# Patient Record
Sex: Female | Born: 1970 | Race: White | Hispanic: No | Smoking: Former smoker
Health system: Southern US, Community
[De-identification: ages and names within clinical notes are randomized; demographics above are authoritative.]

## PROBLEM LIST (undated history)

## (undated) DIAGNOSIS — J45909 Unspecified asthma, uncomplicated: Secondary | ICD-10-CM

## (undated) DIAGNOSIS — A239 Brucellosis, unspecified: Secondary | ICD-10-CM

## (undated) DIAGNOSIS — F419 Anxiety disorder, unspecified: Secondary | ICD-10-CM

## (undated) HISTORY — DX: Anxiety disorder, unspecified: F41.9

## (undated) HISTORY — PX: APPENDECTOMY: SHX54

## (undated) HISTORY — PX: DILATION AND CURETTAGE OF UTERUS: SHX78

## (undated) HISTORY — DX: Unspecified asthma, uncomplicated: J45.909

## (undated) HISTORY — PX: TUBAL LIGATION: SHX77

## (undated) HISTORY — PX: WISDOM TOOTH EXTRACTION: SHX21

---

## 2005-08-25 ENCOUNTER — Inpatient Hospital Stay: Payer: Self-pay | Admitting: Obstetrics and Gynecology

## 2009-03-23 ENCOUNTER — Ambulatory Visit: Payer: Self-pay

## 2009-07-20 ENCOUNTER — Ambulatory Visit: Payer: Self-pay

## 2009-07-21 ENCOUNTER — Ambulatory Visit: Payer: Self-pay

## 2010-03-06 ENCOUNTER — Ambulatory Visit: Payer: Self-pay | Admitting: Family Medicine

## 2010-03-06 ENCOUNTER — Ambulatory Visit: Payer: Self-pay | Admitting: Surgery

## 2010-03-10 LAB — PATHOLOGY REPORT

## 2013-07-10 NOTE — Progress Notes (Signed)
 Briana Chen is a 43 y.o. @GENDER @ that comes today for the following problem(s):   Chief Complaint  Patient presents with  . Follow-up    HPI: The patient in the office for follow up of anxiety, hyperlipidemia and vitamin-D deficiency.  She states she is feeling well overall but did not reduce the dose of her Effexor.  She would like to try and do this in the near future.  She states she is even considering going off the medicine completely and using a Saint John's Wort combination.  She denies sleep disturbance, focus difficulty, mood swings or suicidal/homicidal thoughts.  She continues to try and eat right and exercise regularly.  She denies chest pain, shortness of breath or palpitations.  She continues to take in 2000 IU of vitamin-D over the counter daily.  She denies fatigue.  She denies side effect problems with the medications.   Patient Active Problem List  Diagnosis  . Anxiety  . Asthma without status asthmaticus  . Hyperlipidemia  . Vitamin D  deficiency     Past Medical History  Diagnosis Date  . Anxiety     w/ panic & depression  . Carpal tunnel syndrome   . Asthma without status asthmaticus   . Hyperlipidemia   . Vitamin D  deficiency   . 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 Allergic rhinitis      Past Surgical History  Procedure Laterality Date  . Tubal ligation    . Wisdom teeth extraction    . Closed reduction nasal fracture    . Appendectomy  2012    History   Social History  . Marital Status: Married    Spouse Name: N/A    Number of Children: 2  . Years of Education: N/A   Occupational History  . Not on file.   Social History Main Topics  . Smoking status: Former Games developer  . Smokeless tobacco: Not on file  . Alcohol Use: 0.0 oz/week    0 Not specified per week  . Drug Use: Not on file  . Sexual Activity: Not on file   Other Topics Concern  . Not on file   Social History Narrative    Family History  Problem Relation Age of  Onset  . Depression Mother   . Osteoporosis Mother   . Alcohol abuse Father   . Bipolar disorder Father   . Alcohol abuse Maternal Grandmother   . Diabetes type II Maternal Grandmother   . Coronary artery disease Maternal Grandfather   . Schizophrenia Paternal Grandmother   . Alcohol abuse Paternal Grandmother   . Coronary artery disease Paternal Grandfather   . Stroke Paternal Grandfather      Current outpatient prescriptions:venlafaxine (EFFEXOR-XR) 75 MG XR capsule, Take 1 capsule (75 mg total) by mouth once daily., Disp: 30 capsule, Rfl: 0;  albuterol 90 mcg/actuation inhaler, Inhale 2 inhalations into the lungs every 6 (six) hours as needed for Wheezing., Disp: , Rfl: ;  venlafaxine (EFFEXOR-XR) 37.5 MG XR capsule, Take 1 capsule (37.5 mg total) by mouth once daily., Disp: 30 capsule, Rfl: 5   Objective:  Filed Vitals:   07/10/13 0836  BP: 120/72  Pulse: 73    Physical Examination:  GENERAL:  The patient is alert, oriented and in no acute distress.  HEENT:  Head is normocephalic/atraumatic.  Pupils equal, round and reactive to light and accommodation.  Extraocular movements intact.  Nose and throat are clear. NECK:  Supple without thyromegaly or lymphadenopathy.  CHEST:  Chest wall is within normal limits.   LUNGS:  Clear to auscultation and percussion.   CARDIAC:  Regular rate and rhythm, normal S1 and S2 without murmurs, rubs or gallops.   VASCULAR:  Distal pulses 2+.      EXTREMITIES:  No cyanosis, clubbing or edema noted.   NEUROLOGIC:  The patient is alert and oriented.  No focal deficits.  PSYCHIATRIC:  Normal mood and affect      A/P   Briana Chen was seen today for follow-up.  Diagnoses and associated orders for this visit:  Anxiety  Hyperlipidemia - Comprehensive Metabolic Panel (CMP) - Lipid Panel w/calc LDL  Vitamin D  deficiency - Vitamin D , 25-Hydroxy - [facility]  Other Orders - venlafaxine (EFFEXOR-XR) 75 MG XR capsule; Take 1 capsule (75 mg total)  by mouth once daily. - venlafaxine (EFFEXOR-XR) 37.5 MG XR capsule; Take 1 capsule (37.5 mg total) by mouth once daily.   Rx for 75mg  effexor to use for the next month and then patient with decrease to the 37.5mg  dose.  She will then decide after a couple months whether she wishes to stop completely and take the supplement. Continue otc Vitamin D  Counseled regarding diet and exercise - encourage to make healthy food choices and sensible portion sizes.  Encouraged regular aerobic activity 3-4 days per week. Reviewed most recent labs with patient.  Await above ordered labs Counseled extensively regarding pharmaceuticals vs neutraceuticals.  Return in about 6 months (around 01/09/2014) for Regular follow up.

## 2014-03-03 NOTE — Progress Notes (Signed)
 Briana Chen is a 44 y.o. female that comes today for the following problem(s):   Chief Complaint  Patient presents with  . Anxiety    HPI: The patient in the office for follow-up of anxiety.  She states she is feeling much better now that she is on 75 mg dose of the Effexor.  She will still note occasional difficulty sleeping but states she is not having panic symptoms.  She denies focus less concentration difficulty or suicidal/homicidal thoughts.  She continues to work on her coping mechanisms.  She states she still has a tendency to take on too many things and believes this is what caused her to relapse.  She denies side effect problems with the medications.  Patient Active Problem List  Diagnosis  . Anxiety  . Asthma without status asthmaticus  . Hyperlipidemia  . Vitamin D  deficiency     Past Medical History  Diagnosis Date  . Anxiety     w/ panic & depression  . Carpal tunnel syndrome   . Asthma without status asthmaticus   . Hyperlipidemia   . Vitamin D  deficiency   . 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 Allergic rhinitis      Past Surgical History  Procedure Laterality Date  . Tubal ligation    . Wisdom teeth extraction    . Closed reduction nasal fracture    . Appendectomy  2012    History   Social History  . Marital Status: Married    Spouse Name: N/A    Number of Children: 2  . Years of Education: N/A   Occupational History  . Not on file.   Social History Main Topics  . Smoking status: Former Games developer  . Smokeless tobacco: Not on file  . Alcohol Use: 0.0 oz/week    0 Not specified per week  . Drug Use: Not on file  . Sexual Activity: Not on file   Other Topics Concern  . Not on file   Social History Narrative    Family History  Problem Relation Age of Onset  . Depression Mother   . Osteoporosis Mother   . Alcohol abuse Father   . Bipolar disorder Father   . Alcohol abuse Maternal Grandmother   . Diabetes type II Maternal  Grandmother   . Coronary artery disease Maternal Grandfather   . Schizophrenia Paternal Grandmother   . Alcohol abuse Paternal Grandmother   . Coronary artery disease Paternal Grandfather   . Stroke Paternal Grandfather      No Known Allergies  Prior to Admission medications   Medication Sig Start Date End Date Taking? Authorizing Provider  albuterol 90 mcg/actuation inhaler Inhale 2 inhalations into the lungs every 6 (six) hours as needed for Wheezing.   Yes Historical Provider, MD  venlafaxine (EFFEXOR-XR) 75 MG XR capsule Take 1 capsule (75 mg total) by mouth once daily. 03/03/14 03/03/15 Yes Cheryl Menghini Jeffie Raddle., MD     Objective:  Filed Vitals:   03/03/14 1512  BP: 120/80  Pulse: 86    Physical Examination:  GENERAL:  The patient is alert, oriented and in no acute distress.  HEENT:  Head is normocephalic/atraumatic.  Pupils equal, round and reactive to light and accommodation.  Extraocular movements intact.  Nose and throat are clear. NECK:  Supple without thyromegaly or lymphadenopathy.   CHEST:  Chest wall is within normal limits.   LUNGS:  Clear to auscultation and percussion.   CARDIAC:  Regular rate and  rhythm, normal S1 and S2 without murmurs, rubs or gallops.   VASCULAR:  Distal pulses 2+.   EXTREMITIES:  No cyanosis, clubbing or edema noted.   NEUROLOGIC:  The patient is alert and oriented.  No focal deficits.  PSYCHIATRIC:  Normal mood and affect       A/P   Annise was seen today for anxiety.  Diagnoses and all orders for this visit:  Anxiety - improved Orders: -     venlafaxine (EFFEXOR-XR) 75 MG XR capsule; Take 1 capsule (75 mg total) by mouth once daily.  Continue current medications - renewed as needed Continue to work on coping mechanisms Consider seeing a counselor if anxiety worsens  Return in about 6 months (around 09/01/2014).

## 2014-10-13 NOTE — Progress Notes (Signed)
 Briana Chen is a 44 y.o. female that comes today for the following problem(s):   Chief Complaint  Patient presents with  . Follow-up    HPI: Patient in the office for follow up of chronic medical conditions including anxiety, hyperlipidemia and vitamin-D deficiency.  She states she is feeling well and is without acute complaint or concern.  She has been seeing a therapist and is working hard to improve her coping mechanisms.  She states the in current dose of the Effexor is working very well for her.  She denies mood swings, focus difficulty, sleep disturbance or suicidal/homicidal thoughts.  She continues to exercise regularly and eat a healthy diet.  She denies chest pain, shortness of breath or palpitations.  She is taking her vitamin-D supplement regularly.  She denies side effect problems with the medications.  Patient Active Problem List  Diagnosis  . Anxiety  . Asthma without status asthmaticus  . Hyperlipidemia  . Vitamin D  deficiency     Past Medical History  Diagnosis Date  . Allergic rhinitis   . Anxiety     w/ panic & depression  . Asthma without status asthmaticus   . Carpal tunnel syndrome   . Hyperlipidemia   . 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 Vitamin D  deficiency      Past Surgical History  Procedure Laterality Date  . Tubal ligation    . Wisdom teeth extraction    . Closed reduction nasal fracture    . Appendectomy  2012    Social History   Social History  . Marital status: Married    Spouse name: N/A  . Number of children: 2  . Years of education: N/A   Occupational History  . Not on file.   Social History Main Topics  . Smoking status: Former Games developer  . Smokeless tobacco: Not on file  . Alcohol use 0.0 oz/week    0 Standard drinks or equivalent per week  . Drug use: Not on file  . Sexual activity: Not on file   Other Topics Concern  . Not on file   Social History Narrative    Family History  Problem Relation Age of  Onset  . Depression Mother   . Osteoporosis Mother   . Alcohol abuse Father   . Bipolar disorder Father   . Alcohol abuse Maternal Grandmother   . Diabetes type II Maternal Grandmother   . Coronary artery disease Maternal Grandfather   . Schizophrenia Paternal Grandmother   . Alcohol abuse Paternal Grandmother   . Coronary artery disease Paternal Grandfather   . Stroke Paternal Grandfather      No Known Allergies  Prior to Admission medications   Medication Sig Start Date End Date Taking? Authorizing Provider  albuterol 90 mcg/actuation inhaler Inhale 2 inhalations into the lungs every 6 (six) hours as needed for Wheezing.   Yes Historical Provider, MD  venlafaxine (EFFEXOR-XR) 75 MG XR capsule TAKE 1 CAPSULE (75 MG TOTAL) BY MOUTH ONCE DAILY. 10/04/14  Yes Cheryl Menghini Jeffie Raddle., MD     Objective:  Visit Vitals  . BP 118/64  . Pulse 97  . Ht 172.7 cm (5' 8)  . Wt 61.2 kg (135 lb)  . LMP 10/13/2014 (Exact Date)  . SpO2 97%  . BMI 20.53 kg/m2    Physical Examination:  GENERAL:  The patient is alert, oriented and in no acute distress.  HEENT:  Head is normocephalic/atraumatic.  Pupils equal, round and reactive  to light and accommodation.  Extraocular movements intact.  Nose and throat are clear. NECK:  Supple without thyromegaly or lymphadenopathy.   CHEST:  Chest wall is within normal limits.   LUNGS:  Clear to auscultation and percussion.   CARDIAC:  Regular rate and rhythm, normal S1 and S2 without murmurs, rubs or gallops.   VASCULAR:  Distal pulses 2+.    EXTREMITIES:  No cyanosis, clubbing or edema noted.   NEUROLOGIC:  The patient is alert and oriented.  No focal deficits.       A/P   Briana Chen was seen today for follow-up.  Diagnoses and all orders for this visit:  Anxiety - stable  Pure hypercholesterolemia - stable -     Comprehensive Metabolic Panel (CMP); Future -     Lipid Panel w/calc LDL; Future  Vitamin D  deficiency - stable -     Vitamin  D, 25-Hydroxy - [facility]; Future  Continue current medications -  patient instructed to have the pharmacy contact our office between visits for refills Counseled regarding diet and exercise - encourage to make healthy food choices and sensible portion sizes.  Encouraged regular aerobic activity 3-4 days per week. Reviewed most recent labs with patient.  Await above ordered labs Continue to work on coping mechanisms  Return in about 6 months (around 04/12/2015) for Regular follow up.

## 2015-05-26 NOTE — Progress Notes (Signed)
 Briana Chen is a 45 y.o. female that comes today for the following problem(s):   Chief Complaint  Patient presents with  . Follow-up    HPI: Patient in the office for follow-up of anxiety.  She states she is feeling well and is without acute complaint or concern.  She states the Effexor continues to work well to control her moods.  She denies excessive mood swings, sleep disturbance, focus difficulty or suicidal/homicidal thoughts.  She states her stressors are stable and she wishes to remain on the medication.  She denies side effect problems with the medication.  Patient Active Problem List  Diagnosis  . Anxiety, unspecified  . Asthma without status asthmaticus, unspecified  . Hyperlipidemia, unspecified  . Vitamin D  deficiency, unspecified     Past Medical History  Diagnosis Date  . Allergic rhinitis   . Anxiety, unspecified     w/ panic & depression  . Asthma without status asthmaticus, unspecified   . Carpal tunnel syndrome   . Hyperlipidemia, unspecified   . 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 Vitamin D  deficiency, unspecified      Past Surgical History  Procedure Laterality Date  . Tubal ligation    . Wisdom teeth extraction    . Closed reduction nasal fracture    . Appendectomy  2012    Social History   Social History  . Marital status: Married    Spouse name: N/A  . Number of children: 2  . Years of education: N/A   Occupational History  . Not on file.   Social History Main Topics  . Smoking status: Former Games developer  . Smokeless tobacco: Not on file  . Alcohol use 0.0 oz/week    0 Standard drinks or equivalent per week  . Drug use: Not on file  . Sexual activity: Not on file   Other Topics Concern  . Not on file   Social History Narrative    Family History  Problem Relation Age of Onset  . Depression Mother   . Osteoporosis Mother   . Alcohol abuse Father   . Bipolar disorder Father   . Alcohol abuse Maternal Grandmother   .  Diabetes type II Maternal Grandmother   . Coronary artery disease Maternal Grandfather   . Schizophrenia Paternal Grandmother   . Alcohol abuse Paternal Grandmother   . Coronary artery disease Paternal Grandfather   . Stroke Paternal Grandfather      No Known Allergies  Prior to Admission medications   Medication Sig Taking? Last Dose  albuterol 90 mcg/actuation inhaler Inhale 2 inhalations into the lungs every 6 (six) hours as needed for Wheezing.  Taking  venlafaxine (EFFEXOR-XR) 75 MG XR capsule Take 1 capsule (75 mg total) by mouth once daily.    venlafaxine (EFFEXOR-XR) 75 MG XR capsule TAKE 1 CAPSULE (75 MG TOTAL) BY MOUTH ONCE DAILY.       Objective:  Visit Vitals  . BP 108/60  . Pulse 60  . Ht 172.7 cm (5' 8)  . Wt 61.2 kg (135 lb)  . SpO2 98%  . BMI 20.53 kg/m2    Physical Examination:  GENERAL:  The patient is alert, oriented and in no acute distress.  HEENT:  Head is normocephalic/atraumatic.  Pupils equal, round and reactive to light and accommodation.  Extraocular movements intact.  Nose and throat are clear. NECK:  Supple without thyromegaly or lymphadenopathy.   CHEST:  Chest wall is within normal limits.   LUNGS:  Clear to auscultation and percussion.   CARDIAC:  Regular rate and rhythm, normal S1 and S2 without murmurs, rubs or gallops.   VASCULAR:  Distal pulses 2+.   ABDOMEN:  Soft. No organomegaly or tenderness found.    EXTREMITIES:  No cyanosis, clubbing or edema noted.   NEUROLOGIC:  The patient is alert and oriented.  No focal deficits.       A/P   Bilan was seen today for follow-up.  Diagnoses and all orders for this visit:  Anxiety, unspecified - stable  Pure hypercholesterolemia -     Comprehensive Metabolic Panel (CMP); Future -     Lipid Panel w/calc LDL; Future  Vitamin D  deficiency, unspecified -     Vitamin D , 25-Hydroxy - [facility]; Future  Continue current medications - renewed as needed and patient instructed to have the  pharmacy contact our office between visits for refills Continue to work on coping mechanisms for anxiety Reviewed most recent labs with patient.  Await above ordered labs prior to next appointment  Return in about 6 months (around 11/26/2015) for Regular follow up, Schedule labs.

## 2016-02-22 NOTE — Progress Notes (Signed)
 Briana Chen is a 46 y.o. female that comes today for the following problem(s):   Chief Complaint  Patient presents with  . Influenza    HPI: Patient in the office with complaint of a cough since Monday.  She has also noted the onset of a fever this morning.  She denies nasal congestion or postnasal drip.  She denies sore throat or ear pain.  She notes some shortness of breath and chest tightness but denies wheezing.  She notes sick contacts with similar symptoms.  She has not taken any medications.  Patient Active Problem List  Diagnosis  . Anxiety, unspecified  . Asthma without status asthmaticus, unspecified  . Hyperlipidemia, unspecified  . Vitamin D  deficiency, unspecified     Past Medical History:  Diagnosis Date  . Allergic rhinitis   . Anxiety, unspecified    w/ panic & depression  . Asthma without status asthmaticus, unspecified   . Carpal tunnel syndrome   . Hyperlipidemia, unspecified   . Other nonspecific abnormal finding 2003   CIN-1  . Vitamin D  deficiency, unspecified      Past Surgical History:  Procedure Laterality Date  . APPENDECTOMY  2012  . CLOSED REDUCTION NASAL FRACTURE    . TUBAL LIGATION    . Wisdom Teeth Extraction      Social History   Social History  . Marital status: Married    Spouse name: N/A  . Number of children: 2  . Years of education: N/A   Occupational History  . Not on file.   Social History Main Topics  . Smoking status: Former Games developer  . Smokeless tobacco: Never Used  . Alcohol use 0.0 oz/week  . Drug use: Unknown  . Sexual activity: Not on file   Other Topics Concern  . Not on file   Social History Narrative  . No narrative on file    Family History  Problem Relation Age of Onset  . Depression Mother   . Osteoporosis (Thinning of bones) Mother   . Alcohol abuse Father   . Bipolar disorder Father   . Alcohol abuse Maternal Grandmother   . Diabetes type II Maternal Grandmother   . Coronary Artery Disease  (Blocked arteries around heart) Maternal Grandfather   . Schizophrenia Paternal Grandmother   . Alcohol abuse Paternal Grandmother   . Coronary Artery Disease (Blocked arteries around heart) Paternal Grandfather   . Stroke Paternal Grandfather      No Known Allergies  Prior to Admission medications   Medication Sig Taking? Last Dose  albuterol (VENTOLIN HFA) 90 mcg/actuation inhaler Inhale 2 inhalations into the lungs every 6 (six) hours as needed.    oseltamivir (TAMIFLU) 75 MG capsule Take 1 capsule (75 mg total) by mouth 2 (two) times daily for 5 days.    venlafaxine (EFFEXOR-XR) 75 MG XR capsule Take 1 capsule (75 mg total) by mouth once daily.    venlafaxine (EFFEXOR-XR) 75 MG XR capsule TAKE 1 CAPSULE (75 MG TOTAL) BY MOUTH ONCE DAILY.    venlafaxine (EFFEXOR-XR) 75 MG XR capsule TAKE 1 CAPSULE (75 MG TOTAL) BY MOUTH ONCE DAILY.       Objective:  BP 110/80   Pulse 88   Temp (!) 38.3 C (101 F)   Ht 172.7 cm (5' 8)   Wt 62.6 kg (138 lb)   SpO2 99%   BMI 20.98 kg/m   Physical Examination:  GENERAL:  The patient is alert, oriented and in no acute distress.  HEENT:  Head  is normocephalic/atraumatic.  Pupils equal, round and reactive to light and accommodation.  Extraocular movements intact.  TM clear bilaterally.  Nose with erythema.  Throat with PND.   No sinus tenderness. NECK:  Supple without thyromegaly or lymphadenopathy.   CHEST:  Chest wall is within normal limits.   LUNGS:  Clear to auscultation and percussion.   CARDIAC:  Regular rate and rhythm, normal S1 and S2 without murmurs, rubs or gallops.          A/P   Briana Chen was seen today for influenza.  Diagnoses and all orders for this visit:  Influenza - new -     Influenza A/B -     oseltamivir (TAMIFLU) 75 MG capsule; Take 1 capsule (75 mg total) by mouth 2 (two) times daily for 5 days. -     albuterol (VENTOLIN HFA) 90 mcg/actuation inhaler; Inhale 2 inhalations into the lungs every 6 (six) hours as  needed.  Rx for tamiflu and albuterol Take all prescription medications as directed and for full duration unless otherwise instructed Get plenty of fluids and rest Use otc cough and cold medications as needed, avoiding decongestants if has a history of hypertension Saline nasal spray and salt water gargle as needed Tea with honey and lemon for cough as needed Follow up in the office or call if no improvement in 5-7 days  Return if symptoms worsen or fail to improve.

## 2016-06-08 ENCOUNTER — Telehealth: Payer: Self-pay | Admitting: Obstetrics and Gynecology

## 2016-06-08 NOTE — Telephone Encounter (Signed)
Patient lvm to schedule appointment, I lvm for patient to call back and schedule appointment. Thank you.

## 2016-07-25 ENCOUNTER — Emergency Department
Admission: EM | Admit: 2016-07-25 | Discharge: 2016-07-25 | Disposition: A | Discharge status: Home / Self Care | Payer: BLUE CROSS/BLUE SHIELD | Attending: Emergency Medicine | Admitting: Emergency Medicine

## 2016-07-25 ENCOUNTER — Emergency Department: Payer: BLUE CROSS/BLUE SHIELD

## 2016-07-25 DIAGNOSIS — Z79899 Other long term (current) drug therapy: Secondary | ICD-10-CM | POA: Insufficient documentation

## 2016-07-25 DIAGNOSIS — R42 Dizziness and giddiness: Secondary | ICD-10-CM | POA: Diagnosis present

## 2016-07-25 MED ORDER — DIAZEPAM 5 MG PO TABS: 5.0000 mg | ORAL_TABLET | Freq: Four times a day (QID) | ORAL | 0 refills | Status: AC | PRN

## 2016-07-25 MED ORDER — DIAZEPAM 5 MG PO TABS
5.0000 mg | ORAL_TABLET | Freq: Once | ORAL | Status: AC
  Administered 2016-07-25: 5 mg via ORAL
  Filled 2016-07-25: qty 1

## 2016-07-25 MED ORDER — IOPAMIDOL (ISOVUE-370) INJECTION 76%
75.0000 mL | Freq: Once | INTRAVENOUS | Status: AC | PRN
  Administered 2016-07-25: 75 mL via INTRAVENOUS

## 2016-07-25 MED ORDER — MECLIZINE HCL 25 MG PO TABS
25.0000 mg | ORAL_TABLET | Freq: Once | ORAL | Status: AC
  Administered 2016-07-25: 25 mg via ORAL
  Filled 2016-07-25: qty 1

## 2016-07-25 MED ORDER — MECLIZINE HCL 25 MG PO TABS: 25.0000 mg | ORAL_TABLET | Freq: Three times a day (TID) | ORAL | 0 refills | Status: DC | PRN

## 2016-07-25 MED ORDER — ONDANSETRON HCL 4 MG/2ML IJ SOLN
4.0000 mg | Freq: Once | INTRAMUSCULAR | Status: AC
  Administered 2016-07-25: 4 mg via INTRAVENOUS
  Filled 2016-07-25: qty 2

## 2016-07-25 NOTE — ED Notes (Signed)
Pt ambulated in hallway without assistance. Slight dizziness otherwise pt felt better

## 2016-07-25 NOTE — ED Triage Notes (Signed)
Pt c/o sudden onset dizziness with nausea after having a massage, states that the masseuse stated that she did loosen a knot behind the ear that may have affected her inner ear.

## 2016-07-25 NOTE — ED Notes (Signed)
Pt in CT.

## 2016-07-25 NOTE — ED Provider Notes (Signed)
[facility] Emergency Department Provider Note  ____________________________________________  Time seen: Approximately 5:28 PM  I have reviewed the triage vital signs and the nursing notes.   HISTORY  Chief Complaint Dizziness   HPI Briana Chen is a 46 y.o. female no significant past medical history who presents for evaluation of vertigo. Patient reports that this morning she underwent a massage. Her masseuse asked her to change the position of her head from right to left multiple times. Patient reports that he was massaging behind her left ear when patient felt a pop and had sudden onset of severe nausea. Since then patient has been having vertigo which is worse with movement of her head but has been pretty consistent throughout the day. Has had severe nausea associated with it. No vomiting. No tinnitus, no decreasing here, no diplopia, no dysarthria, no dysphasia, difficulty finding words, no facial droop, no unilateral weakness or numbness. No headache. Patient is not a smoker. No family history of stroke  History reviewed. No pertinent past medical history.  There are no active problems to display for this patient.   History reviewed. No pertinent surgical history.  Prior to Admission medications   Medication Sig Start Date End Date Taking? Authorizing Provider  albuterol (PROVENTIL HFA;VENTOLIN HFA) 108 (90 Base) MCG/ACT inhaler Inhale 2 puffs into the lungs every 6 (six) hours as needed. 02/22/16  Yes [provider]  venlafaxine XR (EFFEXOR-XR) 75 MG 24 hr capsule Take 75 mg by mouth daily. 07/22/16  Yes [provider]  diazepam (VALIUM) 5 MG tablet Take 1 tablet (5 mg total) by mouth every 6 (six) hours as needed (vertigo). 07/25/16 07/25/17  Nita SickleVeronese, Minor, MD  meclizine (ANTIVERT) 25 MG tablet Take 1 tablet (25 mg total) by mouth 3 (three) times daily as needed for dizziness. 07/25/16   Nita SickleVeronese, Lynn, MD    Allergies Patient  has no known allergies.  No family history on file.  Social History Social History  Substance Use Topics  . Smoking status: Never Smoker  . Smokeless tobacco: Never Used  . Alcohol use Yes     Comment: occassionally    Review of Systems  Constitutional: Negative for fever. Eyes: Negative for visual changes. ENT: Negative for sore throat. Neck: No neck pain  Cardiovascular: Negative for chest pain. Respiratory: Negative for shortness of breath. Gastrointestinal: Negative for abdominal pain, vomiting or diarrhea. + nausea Genitourinary: Negative for dysuria. Musculoskeletal: Negative for back pain. Skin: Negative for rash. Neurological: Negative for headaches, weakness or numbness. + vertigo Psych: No SI or HI  ____________________________________________   PHYSICAL EXAM:  VITAL SIGNS: ED Triage Vitals  Enc Vitals Group     BP 07/25/16 1709 119/74     Pulse Rate 07/25/16 1709 78     Resp 07/25/16 1709 18     Temp 07/25/16 1709 99.1 F (37.3 C)     Temp Source 07/25/16 1709 Oral     SpO2 07/25/16 1709 99 %     Weight 07/25/16 1701 130 lb (59 kg)     Height 07/25/16 1701 5\' 8"  (1.727 m)     Head Circumference --      Peak Flow --      Pain Score --      Pain Loc --      Pain Edu? --      Excl. in GC? --     Constitutional: Alert and oriented. Well appearing and in no apparent distress. HEENT:  Head: Normocephalic and atraumatic.         Eyes: Conjunctivae are normal. Sclera is non-icteric.       Ears: Bilateral TMs visualized with no evidence of infection or perforation      Mouth/Throat: Mucous membranes are moist.       Neck: Supple with no signs of meningismus. No bruit Cardiovascular: Regular rate and rhythm. No murmurs, gallops, or rubs. 2+ symmetrical distal pulses are present in all extremities. No JVD. Respiratory: Normal respiratory effort. Lungs are clear to auscultation bilaterally. No wheezes, crackles, or rhonchi.  Gastrointestinal: Soft, non  tender, and non distended with positive bowel sounds. No rebound or guarding. Musculoskeletal: Nontender with normal range of motion in all extremities. No edema, cyanosis, or erythema of extremities. Neurologic: Normal speech and language. A & O x3, PERRL, no nystagmus, CN II-XII intact, motor testing reveals good tone and bulk throughout. There is no evidence of pronator drift or dysmetria. Muscle strength is 5/5 throughout. Deep tendon reflexes are 2+ throughout with downgoing toes. Sensory examination is intact. Gait deferred Skin: Skin is warm, dry and intact. No rash noted. Psychiatric: Mood and affect are normal. Speech and behavior are normal.  ____________________________________________   LABS (all labs ordered are listed, but only abnormal results are displayed)  Labs Reviewed - No data to display ____________________________________________  EKG  none  ____________________________________________  RADIOLOGY  CTA neck: negative  CT head: negative  ____________________________________________   PROCEDURES  Procedure(s) performed:Yes Procedures   Dix Hallpike maneuver performed which elicited vertigo when patient's face was towards the left but no nystagmus  Critical Care performed:  None ____________________________________________   INITIAL IMPRESSION / ASSESSMENT AND PLAN / ED COURSE  46 y.o. female no significant past medical history who presents for evaluation of vertigo that started after patient felt a pop while receiving a massage behind the left ear. Patient is neurologically intact, no nystagmus. Dix-Hallpike produced worsening vertigo however with no nystagmus. She has no bruit on her left carotid. Will get CTA neck to rule out dissection. Will give zofran, valium, and meclezine for symptom relief in case this is BPPV.  Clinical Course as of Jul 26 2003  Wed Jul 25, 2016  1958 CTA of the neck and CT of the head with no acute findings. Patient is now  able to walk without dizziness or vertigo after receiving Valium and meclizine. We'll discharge her home on both prescription medications and Epley maneuver. She remains neuro intact. Recommend follow-up with primary care doctor  [CV]    Clinical Course User Index [CV] Nita Sickle, MD    Pertinent labs & imaging results that were available during my care of the patient were reviewed by me and considered in my medical decision making (see chart for details).    ____________________________________________   FINAL CLINICAL IMPRESSION(S) / ED DIAGNOSES  Final diagnoses:  Vertigo      NEW MEDICATIONS STARTED DURING THIS VISIT:  New Prescriptions   DIAZEPAM (VALIUM) 5 MG TABLET    Take 1 tablet (5 mg total) by mouth every 6 (six) hours as needed (vertigo).   MECLIZINE (ANTIVERT) 25 MG TABLET    Take 1 tablet (25 mg total) by mouth 3 (three) times daily as needed for dizziness.     Note:  This document was prepared using Dragon voice recognition software and may include unintentional dictation errors.    Don Perking, [place], MD 07/25/16 2005

## 2016-08-16 ENCOUNTER — Ambulatory Visit (INDEPENDENT_AMBULATORY_CARE_PROVIDER_SITE_OTHER): Payer: BLUE CROSS/BLUE SHIELD | Admitting: Obstetrics and Gynecology

## 2016-08-16 ENCOUNTER — Encounter: Payer: Self-pay | Admitting: Obstetrics and Gynecology

## 2016-08-16 ENCOUNTER — Other Ambulatory Visit: Payer: Self-pay | Admitting: Obstetrics and Gynecology

## 2016-08-16 VITALS — BP 120/77 | HR 66 | Ht 68.0 in | Wt 134.2 lb

## 2016-08-16 DIAGNOSIS — Z01411 Encounter for gynecological examination (general) (routine) with abnormal findings: Secondary | ICD-10-CM

## 2016-08-16 DIAGNOSIS — L03317 Cellulitis of buttock: Secondary | ICD-10-CM | POA: Diagnosis not present

## 2016-08-16 DIAGNOSIS — L0231 Cutaneous abscess of buttock: Secondary | ICD-10-CM | POA: Diagnosis not present

## 2016-08-16 MED ORDER — CEFIXIME 400 MG PO CAPS: 400.0000 mg | ORAL_CAPSULE | Freq: Every day | ORAL | 1 refills | Status: DC

## 2016-08-16 NOTE — Progress Notes (Signed)
Subjective:   Briana IsaacsJill L Chen is a 46 y.o. G2P2 Caucasian female here for a routine well-woman exam.  Patient's last menstrual period was 08/03/2016.    Current complaints: pimple on buttock x1 week that is getting worse, with foul drainage PCP: Feldpausch       does desire labs  Social History: Sexual: heterosexual Marital Status: separated Living situation: with family Occupation: Systems analystpersonal trainer  Tobacco/alcohol: no tobacco use Illicit drugs: no history of illicit drug use  The following portions of the patient's history were reviewed and updated as appropriate: allergies, current medications, past family history, past medical history, past social history, past surgical history and problem list.  Past Medical History Past Medical History:  Diagnosis Date  . Asthma     Past Surgical History Past Surgical History:  Procedure Laterality Date  . TUBAL LIGATION      Gynecologic History G2P2  Patient's last menstrual period was 08/03/2016. Contraception: tubal ligation Last Pap: 2011. Results were: normal Last mammogram: 2011. Results were: normal   Obstetric History OB History  Gravida Para Term Preterm AB Living  2 2          SAB TAB Ectopic Multiple Live Births               # Outcome Date GA Lbr Len/2nd Weight Sex Delivery Anes PTL Lv  2 Para 2007    M Vag-Spont     1 Para 2003    M Vag-Spont         Current Medications Current Outpatient Prescriptions on File Prior to Visit  Medication Sig Dispense Refill  . albuterol (PROVENTIL HFA;VENTOLIN HFA) 108 (90 Base) MCG/ACT inhaler Inhale 2 puffs into the lungs every 6 (six) hours as needed.    . venlafaxine XR (EFFEXOR-XR) 75 MG 24 hr capsule Take 75 mg by mouth daily.  2  . diazepam (VALIUM) 5 MG tablet Take 1 tablet (5 mg total) by mouth every 6 (six) hours as needed (vertigo). (Patient not taking: Reported on 08/16/2016) 10 tablet 0  . meclizine (ANTIVERT) 25 MG tablet Take 1 tablet (25 mg total) by mouth 3 (three)  times daily as needed for dizziness. (Patient not taking: Reported on 08/16/2016) 30 tablet 0   No current facility-administered medications on file prior to visit.     Review of Systems Patient denies any headaches, blurred vision, shortness of breath, chest pain, abdominal pain, problems with bowel movements, urination, or intercourse.  Objective:  BP 120/77   Pulse 66   Ht 5\' 8"  (1.727 m)   Wt 134 lb 3.2 oz (60.9 kg)   LMP 08/03/2016   BMI 20.41 kg/m  Physical Exam  General:  Well developed, well nourished, no acute distress. She is alert and oriented x3. Skin:  Warm and dry except for boil on right buttock with yellow drainage express, moderate cellulitis measuring 3cm x 2cm. Neck:  Midline trachea, no thyromegaly or nodules Cardiovascular: Regular rate and rhythm, no murmur heard Lungs:  Effort normal, all lung fields clear to auscultation bilaterally Breasts:  No dominant palpable mass, retraction, or nipple discharge Abdomen:  Soft, non tender, no hepatosplenomegaly or masses Pelvic:  External genitalia is normal in appearance.  The vagina is normal in appearance. The cervix is bulbous, no CMT.  Thin prep pap is done with HR HPV cotesting. Uterus is felt to be normal size, shape, and contour.  No adnexal masses or tenderness noted.  Extremities:  No swelling or varicosities noted Psych:  She  has a normal mood and affect  Assessment:   Healthy well-woman exam Cellulitis and boil of right buttock  Plan:  Culture obtained and started on Septra x 10 days. Will return in 2 weeks if not completely healed. F/U 1 year for AE, or sooner if needed Mammogram ordered  Melody Suzan NailerN Shambley, CNM

## 2016-08-16 NOTE — Patient Instructions (Signed)
Preventive Care 18-39 Years, Female Preventive care refers to lifestyle choices and visits with your health care provider that can promote health and wellness. What does preventive care include?  A yearly physical exam. This is also called an annual well check.  Dental exams once or twice a year.  Routine eye exams. Ask your health care provider how often you should have your eyes checked.  Personal lifestyle choices, including: ? Daily care of your teeth and gums. ? Regular physical activity. ? Eating a healthy diet. ? Avoiding tobacco and drug use. ? Limiting alcohol use. ? Practicing safe sex. ? Taking vitamin and mineral supplements as recommended by your health care provider. What happens during an annual well check? The services and screenings done by your health care provider during your annual well check will depend on your age, overall health, lifestyle risk factors, and family history of disease. Counseling Your health care provider may ask you questions about your:  Alcohol use.  Tobacco use.  Drug use.  Emotional well-being.  Home and relationship well-being.  Sexual activity.  Eating habits.  Work and work Statistician.  Method of birth control.  Menstrual cycle.  Pregnancy history.  Screening You may have the following tests or measurements:  Height, weight, and BMI.  Diabetes screening. This is done by checking your blood sugar (glucose) after you have not eaten for a while (fasting).  Blood pressure.  Lipid and cholesterol levels. These may be checked every 5 years starting at age 38.  Skin check.  Hepatitis C blood test.  Hepatitis B blood test.  Sexually transmitted disease (STD) testing.  BRCA-related cancer screening. This may be done if you have a family history of breast, ovarian, tubal, or peritoneal cancers.  Pelvic exam and Pap test. This may be done every 3 years starting at age 38. Starting at age 30, this may be done  every 5 years if you have a Pap test in combination with an HPV test.  Discuss your test results, treatment options, and if necessary, the need for more tests with your health care provider. Vaccines Your health care provider may recommend certain vaccines, such as:  Influenza vaccine. This is recommended every year.  Tetanus, diphtheria, and acellular pertussis (Tdap, Td) vaccine. You may need a Td booster every 10 years.  Varicella vaccine. You may need this if you have not been vaccinated.  HPV vaccine. If you are 39 or younger, you may need three doses over 6 months.  Measles, mumps, and rubella (MMR) vaccine. You may need at least one dose of MMR. You may also need a second dose.  Pneumococcal 13-valent conjugate (PCV13) vaccine. You may need this if you have certain conditions and were not previously vaccinated.  Pneumococcal polysaccharide (PPSV23) vaccine. You may need one or two doses if you smoke cigarettes or if you have certain conditions.  Meningococcal vaccine. One dose is recommended if you are age 68-21 years and a first-year college student living in a residence hall, or if you have one of several medical conditions. You may also need additional booster doses.  Hepatitis A vaccine. You may need this if you have certain conditions or if you travel or work in places where you may be exposed to hepatitis A.  Hepatitis B vaccine. You may need this if you have certain conditions or if you travel or work in places where you may be exposed to hepatitis B.  Haemophilus influenzae type b (Hib) vaccine. You may need this  if you have certain risk factors.  Talk to your health care provider about which screenings and vaccines you need and how often you need them. This information is not intended to replace advice given to you by your health care provider. Make sure you discuss any questions you have with your health care provider. Document Released: 03/06/2001 Document Revised:  09/28/2015 Document Reviewed: 11/09/2014 Elsevier Interactive Patient Education  2017 Elsevier Inc.  

## 2016-08-17 ENCOUNTER — Telehealth: Payer: Self-pay | Admitting: Obstetrics and Gynecology

## 2016-08-17 ENCOUNTER — Other Ambulatory Visit: Payer: Self-pay | Admitting: Obstetrics and Gynecology

## 2016-08-17 LAB — CBC
HEMATOCRIT: 42 % (ref 34.0–46.6)
HEMOGLOBIN: 13.4 g/dL (ref 11.1–15.9)
MCH: 29.7 pg (ref 26.6–33.0)
MCHC: 31.9 g/dL (ref 31.5–35.7)
MCV: 93 fL (ref 79–97)
Platelets: 235 10*3/uL (ref 150–379)
RBC: 4.51 x10E6/uL (ref 3.77–5.28)
RDW: 13.6 % (ref 12.3–15.4)
WBC: 7.9 10*3/uL (ref 3.4–10.8)

## 2016-08-17 LAB — COMPREHENSIVE METABOLIC PANEL
ALBUMIN: 4.4 g/dL (ref 3.5–5.5)
ALK PHOS: 57 IU/L (ref 39–117)
ALT: 16 IU/L (ref 0–32)
AST: 22 IU/L (ref 0–40)
Albumin/Globulin Ratio: 2 (ref 1.2–2.2)
BUN / CREAT RATIO: 21 (ref 9–23)
BUN: 18 mg/dL (ref 6–24)
Bilirubin Total: 0.3 mg/dL (ref 0.0–1.2)
CALCIUM: 9.5 mg/dL (ref 8.7–10.2)
CO2: 26 mmol/L (ref 20–29)
CREATININE: 0.87 mg/dL (ref 0.57–1.00)
Chloride: 102 mmol/L (ref 96–106)
GFR calc Af Amer: 94 mL/min/{1.73_m2} (ref >59)
GFR calc non Af Amer: 82 mL/min/{1.73_m2} (ref >59)
GLUCOSE: 82 mg/dL (ref 65–99)
Globulin, Total: 2.2 g/dL (ref 1.5–4.5)
Potassium: 5.1 mmol/L (ref 3.5–5.2)
Sodium: 141 mmol/L (ref 134–144)
Total Protein: 6.6 g/dL (ref 6.0–8.5)

## 2016-08-17 LAB — CYTOLOGY - PAP

## 2016-08-17 LAB — LIPID PANEL
Chol/HDL Ratio: 2 ratio (ref 0.0–4.4)
Cholesterol, Total: 180 mg/dL (ref 100–199)
HDL: 89 mg/dL (ref >39)
LDL Calculated: 78 mg/dL (ref 0–99)
Triglycerides: 65 mg/dL (ref 0–149)
VLDL CHOLESTEROL CAL: 13 mg/dL (ref 5–40)

## 2016-08-17 LAB — VITAMIN D 25 HYDROXY (VIT D DEFICIENCY, FRACTURES): VIT D 25 HYDROXY: 57 ng/mL (ref 30.0–100.0)

## 2016-08-17 MED ORDER — SULFAMETHOXAZOLE-TRIMETHOPRIM 800-160 MG PO TABS: 1.0000 | ORAL_TABLET | Freq: Two times a day (BID) | ORAL | 1 refills | Status: DC

## 2016-08-17 NOTE — Telephone Encounter (Signed)
-----   Message from Purcell NailsMelody N Shambley, [place] sent at 08/17/2016  2:02 PM EDT ----- Please let here know all blood is normal

## 2016-08-17 NOTE — Telephone Encounter (Signed)
Patient's insurance will not cover the medication that you sent in yesterday, could you send in something else??  [facility]

## 2016-08-17 NOTE — Telephone Encounter (Signed)
[facility] [address] called to inform Briana S./Briana Chen that the prescription that the prescription that was sent in is not covered by insurance, and in result to that they were sent a *Coupon that only complies with [employer]*insurance, So the medication is still 275.00 after the coupon, The pharmacist would like a call back to discuss choosing a different prescription that will be covered by insurance. [facility] number [facility] is [phone]. Please advise.

## 2016-08-17 NOTE — Telephone Encounter (Signed)
Mailed all info to pt 

## 2016-08-17 NOTE — Telephone Encounter (Signed)
Forgot to give her one of the discount card for Suprax- please fax or have her pick it up and take to pharmacy

## 2016-08-17 NOTE — Telephone Encounter (Signed)
Left detailed message about rx

## 2016-08-17 NOTE — Telephone Encounter (Signed)
Insurance want cover medication can we change to something

## 2016-08-17 NOTE — Telephone Encounter (Signed)
pls advise

## 2016-08-20 LAB — ANAEROBIC CULTURE

## 2016-08-21 NOTE — Telephone Encounter (Signed)
-----   Message from Purcell NailsMelody N Shambley, [place] sent at 08/21/2016  1:15 PM EDT ----- Pap is negative

## 2016-08-21 NOTE — Telephone Encounter (Signed)
Mailed info on pap

## 2016-08-30 ENCOUNTER — Encounter: Payer: BLUE CROSS/BLUE SHIELD | Admitting: Obstetrics and Gynecology

## 2016-10-10 ENCOUNTER — Telehealth: Payer: Self-pay | Admitting: Obstetrics and Gynecology

## 2016-10-10 NOTE — Telephone Encounter (Signed)
Pt stated that she is having the same issue that she had at her LOV 08/16/16. Pt is requesting a call back to advise what she needs to do or try. Pharmacy: [facility] [address] Please advise. Thanks TNP

## 2016-10-12 ENCOUNTER — Other Ambulatory Visit: Payer: Self-pay | Admitting: *Deleted

## 2016-10-12 MED ORDER — SULFAMETHOXAZOLE-TRIMETHOPRIM 800-160 MG PO TABS: 1.0000 | ORAL_TABLET | Freq: Two times a day (BID) | ORAL | 1 refills | Status: DC

## 2016-10-12 MED ORDER — CEFIXIME 400 MG PO CAPS: 400.0000 mg | ORAL_CAPSULE | Freq: Every day | ORAL | 1 refills | Status: DC

## 2016-10-15 NOTE — Telephone Encounter (Signed)
Sent in different antibotic due to suprax being too expensive

## 2016-10-18 ENCOUNTER — Encounter: Payer: BLUE CROSS/BLUE SHIELD | Admitting: Obstetrics and Gynecology

## 2016-10-25 ENCOUNTER — Encounter: Payer: Self-pay | Admitting: Obstetrics and Gynecology

## 2016-10-25 ENCOUNTER — Ambulatory Visit (INDEPENDENT_AMBULATORY_CARE_PROVIDER_SITE_OTHER): Payer: BLUE CROSS/BLUE SHIELD | Admitting: Obstetrics and Gynecology

## 2016-10-25 VITALS — BP 98/61 | HR 78 | Ht 68.0 in | Wt 128.7 lb

## 2016-10-25 DIAGNOSIS — L729 Follicular cyst of the skin and subcutaneous tissue, unspecified: Secondary | ICD-10-CM | POA: Diagnosis not present

## 2016-10-25 MED ORDER — SULFAMETHOXAZOLE-TRIMETHOPRIM 800-160 MG PO TABS: 1.0000 | ORAL_TABLET | Freq: Two times a day (BID) | ORAL | 1 refills | Status: DC

## 2016-10-25 NOTE — Progress Notes (Signed)
Subjective:     Patient ID: Briana Chen, female   DOB: June 24, 1970, 46 y.o.   MRN: [id number]  HPI Here for recheck of inclusional cyst on right buttuck, seen originally on 08/16/16 at annual exam and was cultured and treated with 2 rounds of antibiotic. She states it has resolved but another one has appeared on the left side just like it. She has been doing epsom salt soaks with no resolution. Denies fever or drainage, but does report 'knots' in left groin x 1 week.  Review of Systems Negative except stated above in HPI    Objective:   Physical Exam A&Ox4 Well groomed female in no distress Blood pressure 98/61, pulse 78, height  (1.727 m), weight 128 lb 11.2 oz (58.4 kg), last menstrual period 10/21/2016. Pelvic exam: normal external genitalia, vulva, vagina, cervix, uterus and adnexa, left buttuck with small inclusional cyst, slightly red and warm to touch, no drainage expressed and not tender to touch. Also 2 pea-sized lymph nodes noted in left groin..    Assessment:     Left buttuck cyst    Plan:     Will restart antibiotics x 10 days, to add betadine scrub daily as needed. Tylenol as needed. RTC if not resolved.  Jozlin Bently Wesson, CNM

## 2017-04-11 NOTE — Telephone Encounter (Signed)
Error

## 2018-03-18 DIAGNOSIS — F419 Anxiety disorder, unspecified: Secondary | ICD-10-CM | POA: Insufficient documentation

## 2018-03-18 DIAGNOSIS — E559 Vitamin D deficiency, unspecified: Secondary | ICD-10-CM | POA: Insufficient documentation

## 2018-03-18 DIAGNOSIS — J45909 Unspecified asthma, uncomplicated: Secondary | ICD-10-CM | POA: Insufficient documentation

## 2018-03-18 DIAGNOSIS — E785 Hyperlipidemia, unspecified: Secondary | ICD-10-CM | POA: Insufficient documentation

## 2019-01-05 ENCOUNTER — Ambulatory Visit
Admission: EM | Admit: 2019-01-05 | Discharge: 2019-01-05 | Disposition: A | Discharge status: Home / Self Care | Payer: BLUE CROSS/BLUE SHIELD

## 2019-01-05 ENCOUNTER — Other Ambulatory Visit: Payer: Self-pay

## 2019-01-05 DIAGNOSIS — Z20828 Contact with and (suspected) exposure to other viral communicable diseases: Secondary | ICD-10-CM

## 2019-01-05 DIAGNOSIS — H6982 Other specified disorders of Eustachian tube, left ear: Secondary | ICD-10-CM

## 2019-01-05 DIAGNOSIS — Z20822 Contact with and (suspected) exposure to covid-19: Secondary | ICD-10-CM

## 2019-01-05 NOTE — ED Provider Notes (Signed)
Renaldo Fiddler    CSN: [id number] Arrival date & time: 01/05/19  1101      History   Chief Complaint Chief Complaint  Patient presents with  . Appointment    1100  . Otalgia  . Covid test  . Numbness    HPI Briana Chen is a 48 y.o. female.   Patient presents with left ear "fullness" x5 days.  She also reports 2-day history of numbness on the left side of her tongue; and 1 day history of left eye tearing, fever, and left side neck pain.  Tmax 100. She denies nasal congestion, cough, shortness of breath, vomiting, diarrhea, or other symptoms.  She states she has a history of allergies but has not been taking her medication.  She took Tylenol for the fever.    The history is provided by the patient.    Past Medical History:  Diagnosis Date  . Asthma     There are no problems to display for this patient.   Past Surgical History:  Procedure Laterality Date  . TUBAL LIGATION      OB History    Gravida  2   Para  2   Term      Preterm      AB      Living        SAB      TAB      Ectopic      Multiple      Live Births               Home Medications    Prior to Admission medications   Medication Sig Start Date End Date Taking? Authorizing Provider  Multiple Vitamin (MULTIVITAMIN) tablet Take 1 tablet by mouth daily.   Yes [provider]  albuterol (PROVENTIL HFA;VENTOLIN HFA) 108 (90 Base) MCG/ACT inhaler Inhale 2 puffs into the lungs every 6 (six) hours as needed. 02/22/16   [provider]  Cefixime (SUPRAX) 400 MG CAPS capsule Take 1 capsule (400 mg total) by mouth daily. Patient not taking: Reported on 10/25/2016 10/12/16   Purcell Nails, CNM  Cholecalciferol 100 MCG (4000 UT) CAPS Take by mouth.    [provider]  meclizine (ANTIVERT) 25 MG tablet Take 1 tablet (25 mg total) by mouth 3 (three) times daily as needed for dizziness. Patient not taking: Reported on 08/16/2016 07/25/16   Nita Sickle, MD    Omega-3 1000 MG CAPS Take by mouth.    [provider]  sulfamethoxazole-trimethoprim (BACTRIM DS,SEPTRA DS) 800-160 MG tablet Take 1 tablet by mouth 2 (two) times daily. 10/25/16   Shambley, Melody N, CNM  venlafaxine XR (EFFEXOR-XR) 75 MG 24 hr capsule Take 75 mg by mouth daily. 07/22/16   [provider]    Family History Family History  Problem Relation Age of Onset  . Hypertension Father     Social History Social History   Tobacco Use  . Smoking status: Never Smoker  . Smokeless tobacco: Never Used  Substance Use Topics  . Alcohol use: Yes    Comment: occassionally  . Drug use: No     Allergies   Patient has no known allergies.   Review of Systems Review of Systems  Constitutional: Positive for fever. Negative for chills.  HENT: Positive for ear pain. Negative for congestion, rhinorrhea and sore throat.   Eyes: Negative for pain and visual disturbance.  Respiratory: Negative for cough and shortness of breath.   Cardiovascular: Negative  for chest pain and palpitations.  Gastrointestinal: Negative for abdominal pain, diarrhea and vomiting.  Genitourinary: Negative for dysuria and hematuria.  Musculoskeletal: Positive for neck pain. Negative for arthralgias and back pain.  Skin: Negative for color change and rash.  Neurological: Negative for seizures and syncope.  All other systems reviewed and are negative.    Physical Exam Triage Vital Signs ED Triage Vitals  Enc Vitals Group     BP      Pulse      Resp      Temp      Temp src      SpO2      Weight      Height      Head Circumference      Peak Flow      Pain Score      Pain Loc      Pain Edu?      Excl. in [place]?    No data found.  Updated Vital Signs BP 120/81 (BP Location: Left Arm)   Pulse 85   Temp 98.3 F (36.8 C) (Temporal)   Resp 15   LMP  (Within Days) Comment: 10  SpO2 98%   Visual Acuity Right Eye Distance:   Left Eye Distance:   Bilateral Distance:    Right  Eye Near:   Left Eye Near:    Bilateral Near:     Physical Exam Vitals and nursing note reviewed.  Constitutional:      General: She is not in acute distress.    Appearance: She is well-developed. She is not ill-appearing.     Comments: Well-appearing.  HENT:     Head: Normocephalic and atraumatic.     Right Ear: Tympanic membrane and ear canal normal.     Left Ear: Tympanic membrane and ear canal normal.     Nose: Nose normal.     Mouth/Throat:     Mouth: Mucous membranes are moist.     Pharynx: Oropharynx is clear.  Eyes:     Conjunctiva/sclera: Conjunctivae normal.  Cardiovascular:     Rate and Rhythm: Normal rate and regular rhythm.     Heart sounds: No murmur.  Pulmonary:     Effort: Pulmonary effort is normal. No respiratory distress.     Breath sounds: Normal breath sounds.  Abdominal:     General: Bowel sounds are normal.     Palpations: Abdomen is soft.     Tenderness: There is no abdominal tenderness. There is no guarding or rebound.  Musculoskeletal:     Cervical back: Neck supple.  Skin:    General: Skin is warm and dry.     Findings: No rash.  Neurological:     General: No focal deficit present.     Mental Status: She is alert and oriented to person, place, and time.  Psychiatric:        Mood and Affect: Mood normal.        Behavior: Behavior normal.      UC Treatments / Results  Labs (all labs ordered are listed, but only abnormal results are displayed) Labs Reviewed  NOVEL CORONAVIRUS, NAA    EKG   Radiology No results found.  Procedures Procedures (including critical care time)  Medications Ordered in UC Medications - No data to display  Initial Impression / Assessment and Plan / UC Course  I have reviewed the triage vital signs and the nursing notes.  Pertinent labs & imaging results that were available during my  care of the patient were reviewed by me and considered in my medical decision making (see chart for details).    Left  eustachian tube dysfunction.  Suspected Covid.  Instructed patient to take Tylenol as needed for her fever and to take Mucinex for her eustachian tube dysfunction.  COVID test performed here.  Instructed patient to self quarantine until the test result is back.  Instructed patient to go to the emergency department if she develops high fever, shortness of breath, severe diarrhea, or other concerning symptoms.  Patient agrees with plan of care.     Final Clinical Impressions(s) / UC Diagnoses   Final diagnoses:  Dysfunction of left eustachian tube  Suspected COVID-19 virus infection     Discharge Instructions     Take Tylenol as needed for fever and Mucinex for your ear tube dysfunction.    Your COVID test is pending.  You should self quarantine until your test result is back and is negative.    Go to the emergency department if you develop high fever, shortness of breath, severe diarrhea, or other concerning symptoms.        ED Prescriptions    None     PDMP not reviewed this encounter.   Mickie Bailate, Ellawyn Wogan H, NP 01/05/19 1139

## 2019-01-05 NOTE — Discharge Instructions (Addendum)
Take Tylenol as needed for fever and Mucinex for your ear tube dysfunction.    Your COVID test is pending.  You should self quarantine until your test result is back and is negative.    Go to the emergency department if you develop high fever, shortness of breath, severe diarrhea, or other concerning symptoms.

## 2019-01-05 NOTE — ED Triage Notes (Signed)
Pt presents to the UC with left ear pain x 5 days; numbness in the left side of the tongue x 2 days; blurred vision left eye x 1 day, left sided neck pain x 1 day.

## 2019-01-07 LAB — NOVEL CORONAVIRUS, NAA: SARS-CoV-2, NAA: NOT DETECTED

## 2019-04-19 IMAGING — CT CT ANGIO NECK
1 of 7 series · 7 of 33 positions shown · IV contrast (APPLIED)
Comparison: None.

CLINICAL DATA: Dizziness/vertigo after massage this morning.

EXAM:
CT ANGIOGRAPHY NECK
TECHNIQUE: Multidetector CT imaging of the neck was performed using the
standard protocol during bolus administration of intravenous
contrast. Multiplanar CT image reconstructions and MIPs were
obtained to evaluate the vascular anatomy. Carotid stenosis
measurements (when applicable) are obtained utilizing NASCET
criteria, using the distal internal carotid diameter as the
denominator.
CONTRAST:  75 cc Isovue 370 intravenous

[Series 6: ax thin · axial · 0.45mm/px · z∈[-275,-78]mm · 7 of 282 slices shown]
[im 36/282  soft-tissue]
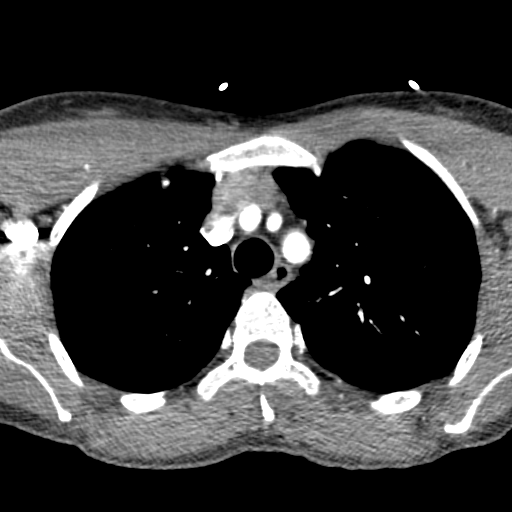
[im 71/282  bone]
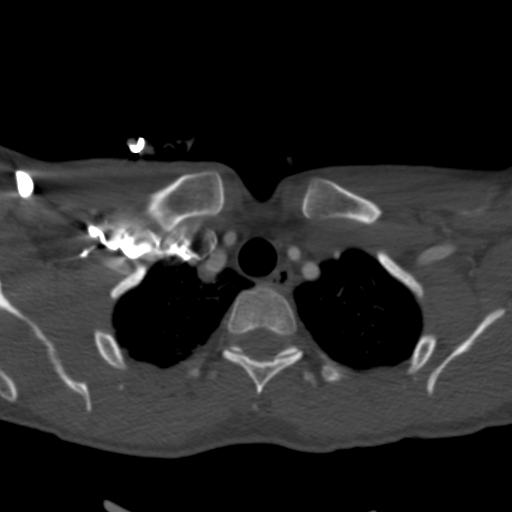
[im 106/282  soft-tissue]
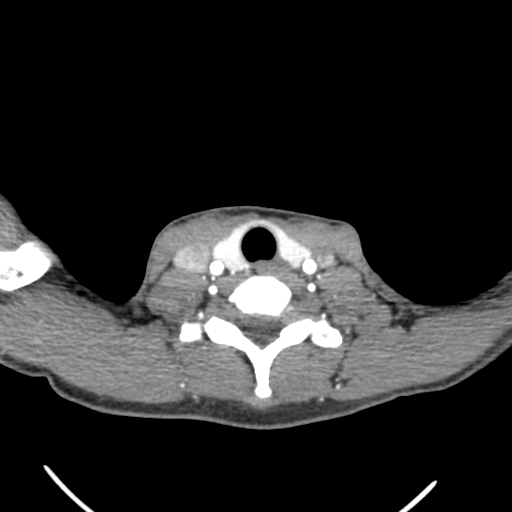
[im 141/282  bone]
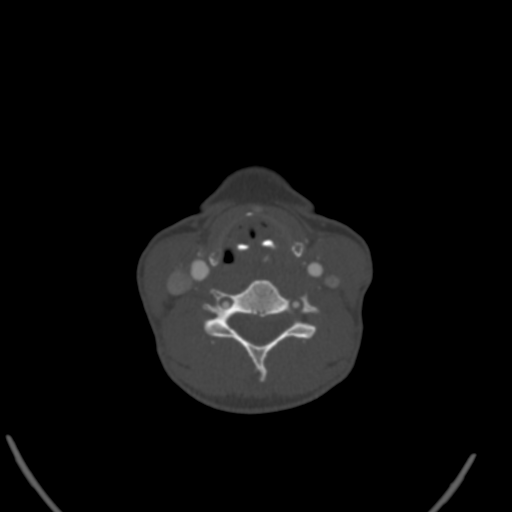
[im 176/282  soft-tissue]
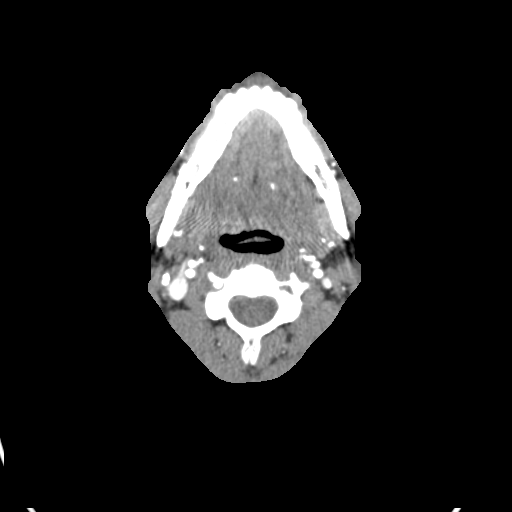
[im 211/282  bone]
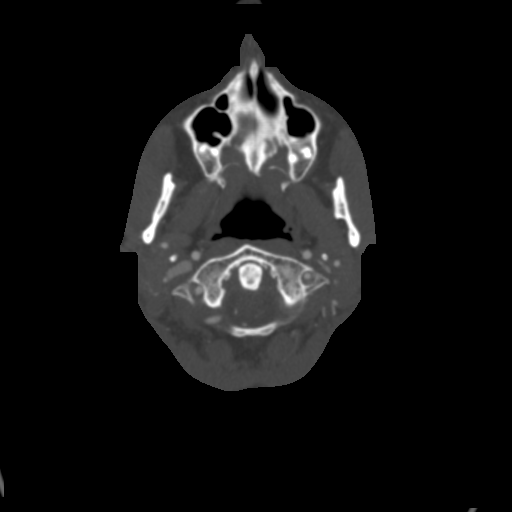
[im 246/282  soft-tissue]
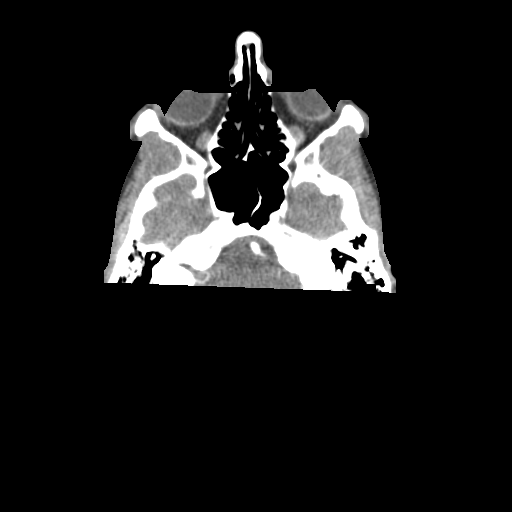

[7 of 33 positions shown; findings below may reference images not displayed]

FINDINGS: Aortic arch: Standard branching. Imaged portion shows no evidence of
aneurysm or dissection. No significant stenosis of the major arch
vessel origins.

Right carotid system: Smooth and widely patent.

Left carotid system: Smooth and widely patent.

Vertebral arteries: Codominant vertebral arteries. No proximal
subclavian stenosis. Both vertebral arteries are smooth and widely
patent. No evidence of dissection.

Skeleton: No acute or aggressive finding. C5-6 disc degeneration
with endplate ridging.

Other neck: No acute finding. There are chronic granulomatous type
calcifications within the upper right cervical lymph nodes. Mastoid
and middle ear spaces are clear.

Upper chest: Negative.
IMPRESSION: Negative CTA of the neck.  No evidence of dissection.

## 2019-05-19 ENCOUNTER — Other Ambulatory Visit: Payer: Self-pay | Admitting: Family Medicine

## 2019-05-19 DIAGNOSIS — Z1231 Encounter for screening mammogram for malignant neoplasm of breast: Secondary | ICD-10-CM

## 2020-01-18 ENCOUNTER — Ambulatory Visit: Payer: Self-pay | Attending: Internal Medicine

## 2020-01-18 DIAGNOSIS — Z23 Encounter for immunization: Secondary | ICD-10-CM

## 2020-01-18 NOTE — Progress Notes (Signed)
   Covid-19 Vaccination Clinic  Name:  Briana Chen    MRN: [id number] DOB: September 07, 1970  01/18/2020  Briana Chen was observed post Covid-19 immunization for 15 minutes without incident. She was provided with Vaccine Information Sheet and instruction to access the V-Safe system.   Briana Chen was instructed to call 911 with any severe reactions post vaccine: Marland Kitchen Difficulty breathing  . Swelling of face and throat  . A fast heartbeat  . A bad rash all over body  . Dizziness and weakness   Immunizations Administered    Name Date Dose VIS Date Route   Pfizer COVID-19 Vaccine 01/18/2020  1:49 PM 0.3 mL 11/11/2019 Intramuscular   Manufacturer: [employer], [employer]   Lot: [id number]   NDC: [address]-3

## 2020-08-31 ENCOUNTER — Encounter: Payer: Self-pay | Admitting: Certified Nurse Midwife

## 2020-09-28 ENCOUNTER — Encounter: Payer: Self-pay | Admitting: Certified Nurse Midwife

## 2020-10-12 ENCOUNTER — Encounter: Payer: Self-pay | Admitting: Certified Nurse Midwife

## 2020-10-13 ENCOUNTER — Encounter: Payer: Self-pay | Admitting: Certified Nurse Midwife

## 2020-11-07 ENCOUNTER — Ambulatory Visit
Admission: RE | Admit: 2020-11-07 | Discharge: 2020-11-07 | Disposition: A | Discharge status: Home / Self Care | Payer: 59 | Source: Ambulatory Visit | Attending: Emergency Medicine | Admitting: Emergency Medicine

## 2020-11-07 ENCOUNTER — Other Ambulatory Visit: Payer: Self-pay

## 2020-11-07 VITALS — BP 114/71 | HR 106 | Temp 99.6°F | Resp 18

## 2020-11-07 DIAGNOSIS — B349 Viral infection, unspecified: Secondary | ICD-10-CM

## 2020-11-07 DIAGNOSIS — Z1152 Encounter for screening for COVID-19: Secondary | ICD-10-CM | POA: Insufficient documentation

## 2020-11-07 HISTORY — DX: Brucellosis, unspecified: A23.9

## 2020-11-07 NOTE — Discharge Instructions (Addendum)
Your COVID and Flu tests are pending.  You should self quarantine until the test results are back.    Take Tylenol or ibuprofen as needed for fever or discomfort.  Rest and keep yourself hydrated.    Follow-up with your primary care provider if your symptoms are not improving.     

## 2020-11-07 NOTE — ED Triage Notes (Signed)
Pt c/o of fever and bodyaches that started last night.

## 2020-11-07 NOTE — ED Provider Notes (Signed)
[name]    CSN: [id number] Arrival date & time: 11/07/20  1403      History   Chief Complaint Chief Complaint  Patient presents with   Fever   Generalized Body Aches    HPI Briana Chen is a 50 y.o. female.  Patient presents with fever and body aches since yesterday evening.  T-max 103.  Treatment at home with Tylenol and ibuprofen; last dose at 11 AM.  She denies rash, sore throat, cough, shortness of breath, vomiting, diarrhea, or other symptoms.  Her medical history includes asthma.  She had COVID in August 2022.  The history is provided by the patient and medical records.   Past Medical History:  Diagnosis Date   Asthma    [place]     Patient Active Problem List   Diagnosis Date Noted   Encounter for screening for COVID-19 11/07/2020     Past Surgical History:  Procedure Laterality Date   TUBAL LIGATION      OB History     Gravida  2   Para  2   Term      Preterm      AB      Living         SAB      IAB      Ectopic      Multiple      Live Births               Home Medications    Prior to Admission medications   Medication Sig Start Date End Date Taking? Authorizing Provider  albuterol (PROVENTIL HFA;VENTOLIN HFA) 108 (90 Base) MCG/ACT inhaler Inhale 2 puffs into the lungs every 6 (six) hours as needed. 02/22/16  Yes [provider]  Cholecalciferol 100 MCG (4000 UT) CAPS Take by mouth.   Yes [provider]  Multiple Vitamin (MULTIVITAMIN) tablet Take 1 tablet by mouth daily.   Yes [provider]  Omega-3 1000 MG CAPS Take by mouth.   Yes [provider]  venlafaxine XR (EFFEXOR-XR) 75 MG 24 hr capsule Take 75 mg by mouth daily. 07/22/16  Yes [provider]  Cefixime (SUPRAX) 400 MG CAPS capsule Take 1 capsule (400 mg total) by mouth daily. Patient not taking: Reported on 10/25/2016 10/12/16   Purcell Nails, CNM  meclizine (ANTIVERT) 25 MG tablet Take 1 tablet (25 mg  total) by mouth 3 (three) times daily as needed for dizziness. Patient not taking: Reported on 08/16/2016 07/25/16   Nita Sickle, MD  sulfamethoxazole-trimethoprim (BACTRIM DS,SEPTRA DS) 800-160 MG tablet Take 1 tablet by mouth 2 (two) times daily. 10/25/16   Purcell Nails, CNM    Family History Family History  Problem Relation Age of Onset   Hypertension Father     Social History Social History   Tobacco Use   Smoking status: Never   Smokeless tobacco: Never  Vaping Use   Vaping Use: Never used  Substance Use Topics   Alcohol use: Yes    Comment: occassionally   Drug use: No     Allergies   Doxycycline   Review of Systems Review of Systems  Constitutional:  Positive for fever. Negative for chills.  HENT:  Negative for ear pain and sore throat.   Respiratory:  Negative for cough and shortness of breath.   Cardiovascular:  Negative for chest pain and palpitations.  Gastrointestinal:  Negative for abdominal pain, diarrhea and vomiting.  Musculoskeletal:  Negative for gait problem  and neck pain.       Body aches  Skin:  Negative for color change and rash.  All other systems reviewed and are negative.   Physical Exam Triage Vital Signs ED Triage Vitals  Enc Vitals Group     BP      Pulse      Resp      Temp      Temp src      SpO2      Weight      Height      Head Circumference      Peak Flow      Pain Score      Pain Loc      Pain Edu?      Excl. in [place]?    No data found.  Updated Vital Signs BP 114/71 (BP Location: Left Arm)   Pulse (!) 106   Temp 99.6 F (37.6 C)   Resp 18   LMP 10/29/2020   SpO2 97%   Visual Acuity Right Eye Distance:   Left Eye Distance:   Bilateral Distance:    Right Eye Near:   Left Eye Near:    Bilateral Near:     Physical Exam Vitals and nursing note reviewed.  Constitutional:      General: She is not in acute distress.    Appearance: She is well-developed. She is ill-appearing.  HENT:     Head:  Normocephalic and atraumatic.     Right Ear: Tympanic membrane normal.     Left Ear: Tympanic membrane normal.     Nose: Nose normal.     Mouth/Throat:     Mouth: Mucous membranes are moist.     Pharynx: Oropharynx is clear.  Eyes:     Conjunctiva/sclera: Conjunctivae normal.  Cardiovascular:     Rate and Rhythm: Normal rate and regular rhythm.     Heart sounds: Normal heart sounds.  Pulmonary:     Effort: Pulmonary effort is normal. No respiratory distress.     Breath sounds: Normal breath sounds.  Abdominal:     Palpations: Abdomen is soft.     Tenderness: There is no abdominal tenderness.  Musculoskeletal:     Cervical back: Neck supple.  Skin:    General: Skin is warm and dry.  Neurological:     General: No focal deficit present.     Mental Status: She is alert and oriented to person, place, and time.     Gait: Gait normal.  Psychiatric:        Mood and Affect: Mood normal.        Behavior: Behavior normal.     UC Treatments / Results  Labs (all labs ordered are listed, but only abnormal results are displayed) Labs Reviewed  COVID-19, FLU A+B NAA    EKG   Radiology No results found.  Procedures Procedures (including critical care time)  Medications Ordered in UC Medications - No data to display  Initial Impression / Assessment and Plan / UC Course  I have reviewed the triage vital signs and the nursing notes.  Pertinent labs & imaging results that were available during my care of the patient were reviewed by me and considered in my medical decision making (see chart for details).   Viral illness.  COVID and Flu pending.  Instructed patient to self quarantine per CDC guidelines.  Discussed symptomatic treatment including Tylenol or ibuprofen, rest, hydration.  Instructed patient to follow up with PCP if symptoms are not  improving.  ED precautions discussed.  Patient agrees to plan of care.    Final Clinical Impressions(s) / UC Diagnoses   Final  diagnoses:  Viral illness     Discharge Instructions      Your COVID and Flu tests are pending.  You should self quarantine until the test results are back.    Take Tylenol or ibuprofen as needed for fever or discomfort.  Rest and keep yourself hydrated.    Follow-up with your primary care provider if your symptoms are not improving.         ED Prescriptions   None    PDMP not reviewed this encounter.   Mickie Bail, NP 11/07/20 1441

## 2020-11-08 LAB — COVID-19, FLU A+B NAA
Influenza A, NAA: NOT DETECTED
Influenza B, NAA: NOT DETECTED
SARS-CoV-2, NAA: NOT DETECTED

## 2021-11-22 ENCOUNTER — Encounter: Payer: 59 | Admitting: Obstetrics & Gynecology

## 2022-01-03 ENCOUNTER — Encounter: Payer: 59 | Admitting: Obstetrics and Gynecology

## 2023-01-09 ENCOUNTER — Other Ambulatory Visit (HOSPITAL_COMMUNITY)
Admission: RE | Admit: 2023-01-09 | Discharge: 2023-01-09 | Disposition: A | Discharge status: Home / Self Care | Payer: BC Managed Care – PPO | Source: Ambulatory Visit | Attending: Family Medicine | Admitting: Family Medicine

## 2023-01-09 ENCOUNTER — Ambulatory Visit: Payer: BC Managed Care – PPO | Admitting: Family Medicine

## 2023-01-09 ENCOUNTER — Encounter: Payer: Self-pay | Admitting: Family Medicine

## 2023-01-09 VITALS — BP 115/79 | HR 73 | Ht 68.0 in | Wt 153.0 lb

## 2023-01-09 DIAGNOSIS — Z124 Encounter for screening for malignant neoplasm of cervix: Secondary | ICD-10-CM | POA: Insufficient documentation

## 2023-01-09 DIAGNOSIS — Z1211 Encounter for screening for malignant neoplasm of colon: Secondary | ICD-10-CM

## 2023-01-09 DIAGNOSIS — Z1151 Encounter for screening for human papillomavirus (HPV): Secondary | ICD-10-CM

## 2023-01-09 DIAGNOSIS — Z01419 Encounter for gynecological examination (general) (routine) without abnormal findings: Secondary | ICD-10-CM | POA: Diagnosis not present

## 2023-01-09 DIAGNOSIS — Z1231 Encounter for screening mammogram for malignant neoplasm of breast: Secondary | ICD-10-CM

## 2023-01-09 NOTE — Progress Notes (Signed)
Patient presents for Annual.  LMP: 12/26/22 Monthly lasting 6/7 days Last pap:  >70yrs ago Hx of abnormal back in [employer] Contraception:  BTL 2007 Mammogram:  >8 yrs ago No Family Hx of Breast Cancer  wants done in , . STD Screening: Declines Flu Vaccine :  Declines   CC: Night sweats *currently on progesterone 200 mg in the evening per pt its a bio-identical form pt unsure of name   Fun Fact:  Patient is  passionate about helping people be healthier. 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

## 2023-01-09 NOTE — Progress Notes (Signed)
Subjective:     Briana Chen is a 52 y.o. female and is here for a comprehensive physical exam. The patient reports no problems. Still having regular cycles.  The following portions of the patient's history were reviewed and updated as appropriate: allergies, current medications, past family history, past medical history, past social history, past surgical history, and problem list.  Review of Systems Pertinent items noted in HPI and remainder of comprehensive ROS otherwise negative.   Objective:    BP 115/79   Pulse 73   Ht 5\' 8"  (1.727 m)   Wt 153 lb (69.4 kg)   BMI 23.26 kg/m  General appearance: alert, cooperative, and appears stated age Head: Normocephalic, without obvious abnormality, atraumatic Neck: no adenopathy, supple, symmetrical, trachea midline, and thyroid not enlarged, symmetric, no tenderness/mass/nodules Lungs: clear to auscultation bilaterally Breasts: normal appearance, no masses or tenderness Heart: regular rate and rhythm, S1, S2 normal, no murmur, click, rub or gallop Abdomen: soft, non-tender; bowel sounds normal; no masses,  no organomegaly Pelvic: cervix normal in appearance, external genitalia normal, no adnexal masses or tenderness, no cervical motion tenderness, uterus normal size, shape, and consistency, and vagina normal without discharge Extremities: extremities normal, atraumatic, no cyanosis or edema Pulses: 2+ and symmetric Skin: Skin color, texture, turgor normal. No rashes or lesions Lymph nodes: Cervical, supraclavicular, and axillary nodes normal. Neurologic: Grossly normal    Assessment:    Healthy female exam.      Plan:  Screening for malignant neoplasm of cervix - Plan: Cytology - PAP  Encounter for gynecological examination without abnormal finding  Encounter for screening mammogram for malignant neoplasm of breast - Plan: MM 3D SCREENING MAMMOGRAM BILATERAL BREAST  Screen for colon cancer - Plan: Ambulatory referral to  Gastroenterology  Return in 1 year (on 01/09/2024).    See After Visit Summary for Counseling Recommendations

## 2023-01-15 LAB — CYTOLOGY - PAP
Adequacy: ABSENT
Comment: NEGATIVE
Diagnosis: NEGATIVE
High risk HPV: NEGATIVE

## 2023-01-17 ENCOUNTER — Encounter: Payer: Self-pay | Admitting: Family Medicine

## 2023-01-31 ENCOUNTER — Ambulatory Visit
Admission: RE | Admit: 2023-01-31 | Discharge: 2023-01-31 | Disposition: A | Discharge status: Home / Self Care | Payer: BC Managed Care – PPO | Source: Ambulatory Visit | Attending: Family Medicine | Admitting: Family Medicine

## 2023-01-31 DIAGNOSIS — Z1231 Encounter for screening mammogram for malignant neoplasm of breast: Secondary | ICD-10-CM

## 2023-08-31 ENCOUNTER — Ambulatory Visit
Admission: EM | Admit: 2023-08-31 | Discharge: 2023-08-31 | Disposition: A | Discharge status: Home / Self Care | Payer: Self-pay | Attending: Emergency Medicine | Admitting: Emergency Medicine

## 2023-08-31 DIAGNOSIS — S71112A Laceration without foreign body, left thigh, initial encounter: Secondary | ICD-10-CM

## 2023-08-31 DIAGNOSIS — Z23 Encounter for immunization: Secondary | ICD-10-CM | POA: Diagnosis not present

## 2023-08-31 MED ORDER — CEPHALEXIN 500 MG PO CAPS: 500.0000 mg | ORAL_CAPSULE | Freq: Three times a day (TID) | ORAL | 0 refills | Status: AC

## 2023-08-31 MED ORDER — TETANUS-DIPHTH-ACELL PERTUSSIS 5-2.5-18.5 LF-MCG/0.5 IM SUSY
0.5000 mL | PREFILLED_SYRINGE | Freq: Once | INTRAMUSCULAR | Status: AC
  Administered 2023-08-31: 0.5 mL via INTRAMUSCULAR

## 2023-08-31 NOTE — ED Triage Notes (Signed)
 Patient to Urgent Care with complaints of a laceration to her left upper leg- reports that she slipped with a hedge trimmer this morning. One laceration present above her knee.   Believes her last TDAP was over 5 years ago.

## 2023-08-31 NOTE — Discharge Instructions (Addendum)
 Take the cephalexin  as directed.  Your tetanus was updated today.  Keep your wound clean and dry.  Wash it gently twice a day with soap and water.  Then apply an antibiotic cream and bandage.    Your stitches need to be taken out in 7 to 10 days.  Follow up right away if you see signs of infection, such as redness, pus-like drainage, fever, or other concerning symptoms.

## 2023-08-31 NOTE — ED Provider Notes (Signed)
 Briana Chen    CSN: [id number] Arrival date & time: 08/31/23  1039      History   Chief Complaint Chief Complaint  Patient presents with   Laceration    HPI Briana Chen is a 53 y.o. female.  Patient presents with a laceration on her left anterior thigh that occurred today when she was using a hedge tremor and accidentally hit her leg.  Bleeding controlled with direct pressure.  No numbness or weakness.  Last tetanus approximately 7 years ago.  The history is provided by the patient and medical records.    Past Medical History:  Diagnosis Date   Anxiety    Asthma    [place]     Patient Active Problem List   Diagnosis Date Noted   Anxiety 03/18/2018   Asthma without status asthmaticus 03/18/2018   Hyperlipidemia 03/18/2018   Vitamin D  deficiency 03/18/2018    Past Surgical History:  Procedure Laterality Date   APPENDECTOMY     DILATION AND CURETTAGE OF UTERUS     TUBAL LIGATION     WISDOM TOOTH EXTRACTION      OB History     Gravida  2   Para  2   Term      Preterm      AB      Living  2      SAB      IAB      Ectopic      Multiple      Live Births               Home Medications    Prior to Admission medications   Medication Sig Start Date End Date Taking? Authorizing Provider  cephALEXin  (KEFLEX ) 500 MG capsule Take 1 capsule (500 mg total) by mouth 3 (three) times daily for 5 days. 08/31/23 09/05/23 Yes Corlis Burnard DEL, NP  albuterol (PROVENTIL HFA;VENTOLIN HFA) 108 (90 Base) MCG/ACT inhaler Inhale 2 puffs into the lungs every 6 (six) hours as needed. 02/22/16   [provider]  Cholecalciferol 100 MCG (4000 UT) CAPS Take by mouth.    [provider]  Multiple Vitamin (MULTIVITAMIN) tablet Take 1 tablet by mouth daily.    [provider]  Omega-3 1000 MG CAPS Take by mouth.    [provider]  venlafaxine XR (EFFEXOR-XR) 75 MG 24 hr capsule Take 75 mg by mouth daily. 07/22/16   [provider]    Family History Family History  Problem Relation Age of Onset   Hyperlipidemia Mother    Alzheimer's disease Mother    Hypertension Father    Hyperlipidemia Brother    Heart disease Maternal Grandfather    Heart disease Paternal Grandfather     Social History Social History   Tobacco Use   Smoking status: Former    Types: Cigarettes   Smokeless tobacco: Never   Tobacco comments:    Quit at age 75  Vaping Use   Vaping status: Never Used  Substance Use Topics   Alcohol use: Yes    Comment: occassionally   Drug use: No     Allergies   Doxycycline   Review of Systems Review of Systems  Constitutional:  Negative for chills and fever.  Musculoskeletal:  Negative for arthralgias and joint swelling.  Skin:  Positive for wound. Negative for color change.  Neurological:  Negative for weakness and numbness.     Physical Exam Triage Vital Signs ED Triage Vitals [08/31/23 1051]  Encounter Vitals Group     BP 120/80     Girls Systolic BP Percentile      Girls Diastolic BP Percentile      Boys Systolic BP Percentile      Boys Diastolic BP Percentile      Pulse Rate 87     Resp 18     Temp 98 F (36.7 C)     Temp src      SpO2 97 %     Weight      Height      Head Circumference      Peak Flow      Pain Score      Pain Loc      Pain Education      Exclude from Growth Chart    No data found.  Updated Vital Signs BP 120/80   Pulse 87   Temp 98 F (36.7 C)   Resp 18   LMP 08/24/2023   SpO2 97%   Visual Acuity Right Eye Distance:   Left Eye Distance:   Bilateral Distance:    Right Eye Near:   Left Eye Near:    Bilateral Near:     Physical Exam Constitutional:      General: She is not in acute distress. HENT:     Mouth/Throat:     Mouth: Mucous membranes are moist.  Cardiovascular:     Rate and Rhythm: Normal rate and regular rhythm.  Pulmonary:     Effort: Pulmonary effort is normal. No respiratory distress.   Musculoskeletal:        General: No deformity. Normal range of motion.  Skin:    General: Skin is warm and dry.     Capillary Refill: Capillary refill takes less than 2 seconds.     Findings: Lesion present.     Comments: Jagged laceration of left anterior thigh just above knee.  See picture.   Neurological:     General: No focal deficit present.     Mental Status: She is alert.     Sensory: No sensory deficit.     Motor: No weakness.     Gait: Gait normal.      UC Treatments / Results  Labs (all labs ordered are listed, but only abnormal results are displayed) Labs Reviewed - No data to display  EKG   Radiology No results found.  Procedures Laceration Repair  Date/Time: 08/31/2023 11:40 AM  Performed by: Corlis Burnard DEL, NP Authorized by: Corlis Burnard DEL, NP   Consent:    Consent obtained:  Verbal   Consent given by:  Patient   Risks discussed:  Infection, pain, poor cosmetic result and poor wound healing Universal protocol:    Procedure explained and questions answered to patient or proxy's satisfaction: yes   Anesthesia:    Anesthesia method:  Local infiltration   Local anesthetic:  Lidocaine 1% w/o epi Laceration details:    Location:  Leg   Leg location:  L upper leg   Length (cm):  4   Depth (mm):  4 Pre-procedure details:    Preparation:  Patient was prepped and draped in usual sterile fashion Exploration:    Hemostasis achieved with:  Direct pressure   Imaging outcome: foreign body not noted     Wound exploration: wound explored through full range of motion and entire depth of wound visualized   Treatment:    Area cleansed with:  Povidone-iodine   Amount of cleaning:  Standard  Irrigation solution:  Sterile water   Irrigation method:  Syringe   Visualized foreign bodies/material removed: no   Skin repair:    Repair method:  Sutures   Suture size:  3-0   Suture material:  Prolene   Suture technique:  Simple interrupted   Number of sutures:   6 Approximation:    Approximation:  Close Repair type:    Repair type:  Intermediate Post-procedure details:    Dressing:  Antibiotic ointment and non-adherent dressing   Procedure completion:  Tolerated well, no immediate complications  (including critical care time)  Medications Ordered in UC Medications  Tdap (BOOSTRIX ) injection 0.5 mL (0.5 mLs Intramuscular Given 08/31/23 1141)    Initial Impression / Assessment and Plan / UC Course  I have reviewed the triage vital signs and the nursing notes.  Pertinent labs & imaging results that were available during my care of the patient were reviewed by me and considered in my medical decision making (see chart for details).    Laceration of left thigh.  6 sutures.  Tetanus updated.  Wound care instructions and signs of infection discussed.  Treating today with cephalexin .  Instructed patient to return right away if she notes signs of infection.  Discussed that her sutures need to be taken out in 7 to 10 days.  Education provided on laceration care.  She agrees to plan of care.  Final Clinical Impressions(s) / UC Diagnoses   Final diagnoses:  Laceration of left thigh, initial encounter     Discharge Instructions      Take the cephalexin  as directed.  Your tetanus was updated today.  Keep your wound clean and dry.  Wash it gently twice a day with soap and water.  Then apply an antibiotic cream and bandage.    Your stitches need to be taken out in 7 to 10 days.  Follow up right away if you see signs of infection, such as redness, pus-like drainage, fever, or other concerning symptoms.        ED Prescriptions     Medication Sig Dispense Auth. Provider   cephALEXin  (KEFLEX ) 500 MG capsule Take 1 capsule (500 mg total) by mouth 3 (three) times daily for 5 days. 15 capsule Corlis Burnard DEL, NP      PDMP not reviewed this encounter.   Corlis Burnard DEL, NP 08/31/23 1151

## 2023-09-06 ENCOUNTER — Ambulatory Visit

## 2023-09-09 ENCOUNTER — Ambulatory Visit
Admission: RE | Admit: 2023-09-09 | Discharge: 2023-09-09 | Disposition: A | Discharge status: Home / Self Care | Payer: Self-pay | Attending: Family Medicine | Admitting: Family Medicine

## 2023-09-09 DIAGNOSIS — Z4802 Encounter for removal of sutures: Secondary | ICD-10-CM | POA: Diagnosis not present

## 2023-09-09 NOTE — ED Triage Notes (Signed)
 Patient to Urgent Care for suture removal.  Sutures placed 9 days ago- 6 sutures presents to left thigh after incident with a hedge trimmer.  Wound appears well-healed. Denies any other complaints.
